# Patient Record
Sex: Female | Born: 1960 | Race: White | Hispanic: Yes | Marital: Married | Smoking: Never smoker
Health system: Southern US, Community
[De-identification: ages and names within clinical notes are randomized; demographics above are authoritative.]

---

## 2005-05-10 ENCOUNTER — Emergency Department (HOSPITAL_COMMUNITY): Admission: EM | Admit: 2005-05-10 | Discharge: 2005-05-10 | Payer: Self-pay | Admitting: Emergency Medicine

## 2008-05-15 ENCOUNTER — Emergency Department (HOSPITAL_COMMUNITY): Admission: EM | Admit: 2008-05-15 | Discharge: 2008-05-15 | Payer: Self-pay | Admitting: Emergency Medicine

## 2013-04-11 ENCOUNTER — Encounter (HOSPITAL_COMMUNITY): Payer: Self-pay | Admitting: Emergency Medicine

## 2013-04-11 ENCOUNTER — Emergency Department (HOSPITAL_COMMUNITY): Payer: Self-pay

## 2013-04-11 ENCOUNTER — Emergency Department (HOSPITAL_COMMUNITY)
Admission: EM | Admit: 2013-04-11 | Discharge: 2013-04-11 | Disposition: A | Discharge status: Home / Self Care | Payer: Self-pay | Attending: Emergency Medicine | Admitting: Emergency Medicine

## 2013-04-11 DIAGNOSIS — IMO0002 Reserved for concepts with insufficient information to code with codable children: Secondary | ICD-10-CM | POA: Insufficient documentation

## 2013-04-11 DIAGNOSIS — M1710 Unilateral primary osteoarthritis, unspecified knee: Secondary | ICD-10-CM | POA: Insufficient documentation

## 2013-04-11 DIAGNOSIS — M1712 Unilateral primary osteoarthritis, left knee: Secondary | ICD-10-CM

## 2013-04-11 MED ORDER — ONDANSETRON HCL 4 MG PO TABS
4.0000 mg | ORAL_TABLET | Freq: Once | ORAL | Status: AC
  Administered 2013-04-11: 4 mg via ORAL
  Filled 2013-04-11: qty 1

## 2013-04-11 MED ORDER — HYDROCODONE-ACETAMINOPHEN 5-325 MG PO TABS: 1.0000 | ORAL_TABLET | ORAL | Status: DC | PRN

## 2013-04-11 MED ORDER — HYDROCODONE-ACETAMINOPHEN 5-325 MG PO TABS
2.0000 | ORAL_TABLET | Freq: Once | ORAL | Status: AC
  Administered 2013-04-11: 2 via ORAL
  Filled 2013-04-11: qty 2

## 2013-04-11 MED ORDER — IBUPROFEN 800 MG PO TABS: 800.0000 mg | ORAL_TABLET | Freq: Three times a day (TID) | ORAL | Status: DC

## 2013-04-11 MED ORDER — IBUPROFEN 800 MG PO TABS
800.0000 mg | ORAL_TABLET | Freq: Once | ORAL | Status: AC
  Administered 2013-04-11: 800 mg via ORAL
  Filled 2013-04-11: qty 1

## 2013-04-11 NOTE — ED Notes (Signed)
Patient c/o left knee pain and swelling x 2 weeks.  States hurts worse to walk or bend.

## 2013-04-11 NOTE — ED Provider Notes (Signed)
CSN: [id number]     Arrival date & time 04/11/13  1751 History   First MD Initiated Contact with Patient 04/11/13 2030     Chief Complaint  Patient presents with  . Knee Pain   (Consider location/radiation/quality/duration/timing/severity/associated sxs/prior Treatment) Patient is a 52 y.o. female presenting with knee pain. The history is provided by the patient. The history is limited by a language barrier. A language interpreter was used.  Knee Pain Location:  Knee Time since incident:  2 weeks Injury: no   Knee location:  L knee Pain details:    Quality:  Aching and throbbing   Radiates to:  Does not radiate   Severity:  Moderate   Onset quality:  Gradual   Duration:  2 weeks   Timing:  Intermittent   Progression:  Worsening Chronicity:  Recurrent Dislocation: no   Foreign body present:  No foreign bodies Prior injury to area:  No Relieved by:  Nothing Exacerbated by: standing or bending. Associated symptoms: decreased ROM and stiffness   Associated symptoms: no back pain, no fever, no neck pain and no numbness     History reviewed. No pertinent past medical history. Past Surgical History  Procedure Laterality Date  . Cesarean section     No family history on file. History  Substance Use Topics  . Smoking status: Never Smoker   . Smokeless tobacco: Not on file  . Alcohol Use: No   OB History   Grav Para Term Preterm Abortions TAB SAB Ect Mult Living                 Review of Systems  Constitutional: Negative for fever and activity change.       All ROS Neg except as noted in HPI  HENT: Negative for nosebleeds and neck pain.   Eyes: Negative for photophobia and discharge.  Respiratory: Negative for cough, shortness of breath and wheezing.   Cardiovascular: Negative for chest pain and palpitations.  Gastrointestinal: Negative for abdominal pain and blood in stool.  Genitourinary: Negative for dysuria, frequency and hematuria.  Musculoskeletal: Positive for  arthralgias and stiffness. Negative for back pain.  Skin: Negative.   Neurological: Negative for dizziness, seizures and speech difficulty.  Psychiatric/Behavioral: Negative for hallucinations and confusion.    Allergies  Review of patient's allergies indicates no known allergies.  Home Medications  No current outpatient prescriptions on file. BP 140/87  Pulse 80  Temp(Src) 98 F (36.7 C) (Oral)  Resp 20  Ht 5\' 5"  (1.651 m)  Wt 187 lb (84.823 kg)  BMI 31.12 kg/m2  SpO2 100% Physical Exam  Nursing note and vitals reviewed. Constitutional: She is oriented to person, place, and time. She appears well-developed and well-nourished.  Non-toxic appearance.  HENT:  Head: Normocephalic.  Right Ear: Tympanic membrane and external ear normal.  Left Ear: Tympanic membrane and external ear normal.  Eyes: EOM and lids are normal. Pupils are equal, round, and reactive to light.  Neck: Normal range of motion. Neck supple. Carotid bruit is not present.  Cardiovascular: Normal rate, regular rhythm, normal heart sounds, intact distal pulses and normal pulses.   Pulmonary/Chest: Breath sounds normal. No respiratory distress.  Abdominal: Soft. Bowel sounds are normal. There is no tenderness. There is no guarding.  Musculoskeletal: Normal range of motion.  FROM of the left knee and ankle. Pain with flex/ext of the left knee. Crepitus present. No mass. No effusion. Left knee warmer than right.  Lymphadenopathy:       Head (  right side): No submandibular adenopathy present.       Head (left side): No submandibular adenopathy present.    She has no cervical adenopathy.  Neurological: She is alert and oriented to person, place, and time. She has normal strength. No cranial nerve deficit or sensory deficit.  Skin: Skin is warm and dry.  Psychiatric: She has a normal mood and affect. Her speech is normal.    ED Course  Procedures (including critical care time) Labs Review Labs Reviewed - No data to  display Imaging Review Dg Knee Complete 4 Views Left  04/11/2013   *RADIOLOGY REPORT*  Clinical Data: Left knee pain and swelling without injury  LEFT KNEE - COMPLETE 4+ VIEW  Comparison: None.  Findings: No acute fracture or dislocation is noted.  No gross soft tissue abnormality is seen.  No joint effusion is noted.  IMPRESSION: No acute abnormality noted.   Original Report Authenticated By: Alcide Clever, M.D.    MDM  No diagnosis found. **I have reviewed nursing notes, vital signs, and all appropriate lab and imaging results for this patient.*  Pt states through interpreter that she does a lot of standing and bending. She has had problem with her knees in the past, but they are worse today. Xray of the left knee is negative for fx or dislocation. Pt has crepitus with movement. Some stiffness noted. Suspect DJD to be the problem.  Kathie Dike, PA-C 04/12/13 0102

## 2013-04-15 NOTE — ED Provider Notes (Signed)
Medical screening examination/treatment/procedure(s) were performed by non-physician practitioner and as supervising physician I was immediately available for consultation/collaboration.  Tylin Stradley, MD 04/15/13 1538 

## 2013-04-22 ENCOUNTER — Telehealth: Payer: Self-pay | Admitting: Orthopedic Surgery

## 2013-04-22 NOTE — Telephone Encounter (Signed)
Kelly Hayden, daughter of Kelly Hayden called today to schedule an appointment  For Kelly Hayden to be seen today as a followup from [facility] 04/11/13 for knee pain /sweling. Told her we had nothing available today and it will most likely be next week.  She said she will call Kelly Hayden, and try to get something for today.  Will call us back if  she needs to.  Her # [phone]

## 2013-04-26 ENCOUNTER — Other Ambulatory Visit (HOSPITAL_COMMUNITY): Payer: Self-pay | Admitting: Orthopaedic Surgery

## 2013-04-26 DIAGNOSIS — M25562 Pain in left knee: Secondary | ICD-10-CM

## 2013-05-01 ENCOUNTER — Ambulatory Visit (HOSPITAL_COMMUNITY): Payer: Self-pay

## 2013-05-08 ENCOUNTER — Encounter: Payer: Self-pay | Admitting: Orthopedic Surgery

## 2013-05-08 ENCOUNTER — Ambulatory Visit: Payer: Self-pay | Admitting: Orthopedic Surgery

## 2013-11-08 ENCOUNTER — Encounter (HOSPITAL_COMMUNITY): Payer: Self-pay | Admitting: Emergency Medicine

## 2013-11-08 ENCOUNTER — Emergency Department (HOSPITAL_COMMUNITY)
Admission: EM | Admit: 2013-11-08 | Discharge: 2013-11-08 | Disposition: A | Discharge status: Home / Self Care | Payer: Self-pay | Attending: Emergency Medicine | Admitting: Emergency Medicine

## 2013-11-08 DIAGNOSIS — Z9889 Other specified postprocedural states: Secondary | ICD-10-CM | POA: Insufficient documentation

## 2013-11-08 DIAGNOSIS — N390 Urinary tract infection, site not specified: Secondary | ICD-10-CM | POA: Insufficient documentation

## 2013-11-08 LAB — URINALYSIS, ROUTINE W REFLEX MICROSCOPIC
Bilirubin Urine: NEGATIVE
GLUCOSE, UA: NEGATIVE mg/dL
Ketones, ur: NEGATIVE mg/dL
Nitrite: NEGATIVE
PH: 6.5 (ref 5.0–8.0)
Specific Gravity, Urine: 1.01 (ref 1.005–1.030)
Urobilinogen, UA: 0.2 mg/dL (ref 0.0–1.0)

## 2013-11-08 LAB — URINE MICROSCOPIC-ADD ON

## 2013-11-08 MED ORDER — CEPHALEXIN 500 MG PO CAPS
500.0000 mg | ORAL_CAPSULE | Freq: Once | ORAL | Status: AC
  Administered 2013-11-08: 500 mg via ORAL
  Filled 2013-11-08: qty 1

## 2013-11-08 MED ORDER — CEPHALEXIN 500 MG PO CAPS: 500.0000 mg | ORAL_CAPSULE | Freq: Three times a day (TID) | ORAL | Status: DC

## 2013-11-08 NOTE — ED Provider Notes (Signed)
CSN: [id number]     Arrival date & time 11/08/13  0617 History  This chart was scribed for Benny Lennert, MD by Leone Payor, ED Scribe. This patient was seen in room [facility] and the patient's care was started 7:16 AM.    Chief Complaint  Patient presents with  . Dysuria      Patient is a 53 y.o. female presenting with dysuria. The history is provided by the patient. No language interpreter was used.  Dysuria Pain severity:  Moderate Onset quality:  Gradual Duration:  2 days Progression:  Worsening Chronicity:  New Relieved by:  Nothing Worsened by:  Nothing tried Urinary symptoms: frequent urination   Associated symptoms: no abdominal pain, no nausea and no vomiting     HPI Comments: Mineola Duan is a 53 y.o. female who presents to the Emergency Department complaining of gradual onset, gradually worsening dysuria with associated urinary frequency that began 2 days ago. She states the pain is not as severe when laying in bed. She denies vomiting.    History reviewed. No pertinent past medical history. Past Surgical History  Procedure Laterality Date  . Cesarean section     No family history on file. History  Substance Use Topics  . Smoking status: Never Smoker   . Smokeless tobacco: Not on file  . Alcohol Use: No   OB History   Grav Para Term Preterm Abortions TAB SAB Ect Mult Living                 Review of Systems  Constitutional: Negative for appetite change and fatigue.  HENT: Negative for congestion, ear discharge and sinus pressure.   Eyes: Negative for discharge.  Respiratory: Negative for cough.   Cardiovascular: Negative for chest pain.  Gastrointestinal: Negative for nausea, vomiting, abdominal pain and diarrhea.  Genitourinary: Positive for dysuria and frequency. Negative for hematuria.  Musculoskeletal: Negative for back pain.  Skin: Negative for rash.  Neurological: Negative for seizures and headaches.  Psychiatric/Behavioral: Negative  for hallucinations.      Allergies  Review of patient's allergies indicates no known allergies.  Home Medications   Current Outpatient Rx  Name  Route  Sig  Dispense  Refill  . HYDROcodone-acetaminophen (NORCO/VICODIN) 5-325 MG per tablet   Oral   Take 1 tablet by mouth every 4 (four) hours as needed for pain.   20 tablet   0   . ibuprofen (ADVIL,MOTRIN) 800 MG tablet   Oral   Take 1 tablet (800 mg total) by mouth 3 (three) times daily.   21 tablet   0    BP 131/75  Pulse 76  Temp(Src) 97.8 F (36.6 C) (Oral)  Resp 18  Ht 5\' 5"  (1.651 m)  Wt 185 lb (83.915 kg)  BMI 30.79 kg/m2  SpO2 96% Physical Exam  Nursing note and vitals reviewed. Constitutional: She is oriented to person, place, and time. She appears well-developed.  HENT:  Head: Normocephalic.  Eyes: Conjunctivae and EOM are normal. No scleral icterus.  Neck: Neck supple. No thyromegaly present.  Cardiovascular: Normal rate, regular rhythm and normal heart sounds.  Exam reveals no gallop and no friction rub.   No murmur heard. Pulmonary/Chest: Effort normal and breath sounds normal. No stridor. No respiratory distress. She has no wheezes. She has no rales. She exhibits no tenderness.  Abdominal: Soft. She exhibits no distension. There is no tenderness. There is no rebound.  Musculoskeletal: Normal range of motion. She exhibits no edema.  Lymphadenopathy:  She has no cervical adenopathy.  Neurological: She is oriented to person, place, and time. She exhibits normal muscle tone. Coordination normal.  Skin: No rash noted. No erythema.  Psychiatric: She has a normal mood and affect. Her behavior is normal.    ED Course  Procedures (including critical care time)  DIAGNOSTIC STUDIES: Oxygen Saturation is 96% on RA, adequate by my interpretation.    COORDINATION OF CARE: 7:17 AM Discussed treatment plan with pt at bedside and pt agreed to plan.   Labs Review Labs Reviewed  URINALYSIS, ROUTINE W REFLEX  MICROSCOPIC - Abnormal; Notable for the following:    Hgb urine dipstick LARGE (*)    Protein, ur TRACE (*)    Leukocytes, UA MODERATE (*)    All other components within normal limits  URINE MICROSCOPIC-ADD ON - Abnormal; Notable for the following:    Bacteria, UA FEW (*)    All other components within normal limits  URINE CULTURE   Imaging Review No results found.   EKG Interpretation None      MDM   Final diagnoses:  None    The chart was scribed for me under my direct supervision.  I personally performed the history, physical, and medical decision making and all procedures in the evaluation of this patient.Benny Lennert.   Dezmen Alcock L Aerial Dilley, MD 11/08/13 [phone]

## 2013-11-08 NOTE — ED Notes (Signed)
Urinary frequency/burning for 2 days  Noted small blood clot in urine this morning

## 2013-11-08 NOTE — Discharge Instructions (Signed)
Follow up when your finish your medicine

## 2013-11-10 LAB — URINE CULTURE

## 2018-05-08 ENCOUNTER — Emergency Department (HOSPITAL_COMMUNITY)
Admission: EM | Admit: 2018-05-08 | Discharge: 2018-05-08 | Disposition: A | Discharge status: Home / Self Care | Payer: Self-pay | Attending: Emergency Medicine | Admitting: Emergency Medicine

## 2018-05-08 ENCOUNTER — Other Ambulatory Visit: Payer: Self-pay

## 2018-05-08 ENCOUNTER — Emergency Department (HOSPITAL_COMMUNITY): Payer: Self-pay

## 2018-05-08 ENCOUNTER — Encounter (HOSPITAL_COMMUNITY): Payer: Self-pay | Admitting: Emergency Medicine

## 2018-05-08 DIAGNOSIS — N3091 Cystitis, unspecified with hematuria: Secondary | ICD-10-CM | POA: Insufficient documentation

## 2018-05-08 LAB — COMPREHENSIVE METABOLIC PANEL
ALBUMIN: 4.9 g/dL (ref 3.5–5.0)
ALT: 35 U/L (ref 0–44)
AST: 28 U/L (ref 15–41)
Alkaline Phosphatase: 79 U/L (ref 38–126)
Anion gap: 12 (ref 5–15)
BILIRUBIN TOTAL: 0.8 mg/dL (ref 0.3–1.2)
BUN: 11 mg/dL (ref 6–20)
CO2: 24 mmol/L (ref 22–32)
Calcium: 9.7 mg/dL (ref 8.9–10.3)
Chloride: 104 mmol/L (ref 98–111)
Creatinine, Ser: 0.62 mg/dL (ref 0.44–1.00)
GFR calc Af Amer: >60 mL/min (ref >60)
GFR calc non Af Amer: >60 mL/min (ref >60)
GLUCOSE: 110 mg/dL — AB (ref 70–99)
POTASSIUM: 3.5 mmol/L (ref 3.5–5.1)
Sodium: 140 mmol/L (ref 135–145)
TOTAL PROTEIN: 8.8 g/dL — AB (ref 6.5–8.1)

## 2018-05-08 LAB — URINALYSIS, ROUTINE W REFLEX MICROSCOPIC
Bilirubin Urine: NEGATIVE
Glucose, UA: NEGATIVE mg/dL
Nitrite: NEGATIVE

## 2018-05-08 LAB — LIPASE, BLOOD: LIPASE: 29 U/L (ref 11–51)

## 2018-05-08 LAB — URINALYSIS, MICROSCOPIC (REFLEX)
Bacteria, UA: NONE SEEN
Squamous Epithelial / LPF: NONE SEEN (ref 0–5)
WBC, UA: NONE SEEN WBC/hpf (ref 0–5)

## 2018-05-08 LAB — CBC
HCT: 45.3 % (ref 36.0–46.0)
Hemoglobin: 15.6 g/dL — ABNORMAL HIGH (ref 12.0–15.0)
MCH: 29.9 pg (ref 26.0–34.0)
MCHC: 34.4 g/dL (ref 30.0–36.0)
MCV: 86.9 fL (ref 80.0–100.0)
PLATELETS: 284 10*3/uL (ref 150–400)
RBC: 5.21 MIL/uL — AB (ref 3.87–5.11)
RDW: 12.4 % (ref 11.5–15.5)
WBC: 17 10*3/uL — ABNORMAL HIGH (ref 4.0–10.5)
nRBC: 0 % (ref 0.0–0.2)

## 2018-05-08 LAB — SAMPLE TO BLOOD BANK

## 2018-05-08 MED ORDER — SODIUM CHLORIDE 0.9 % IV BOLUS
1000.0000 mL | Freq: Once | INTRAVENOUS | Status: AC
  Administered 2018-05-08: 1000 mL via INTRAVENOUS

## 2018-05-08 MED ORDER — CEPHALEXIN 500 MG PO CAPS: 500.0000 mg | ORAL_CAPSULE | Freq: Four times a day (QID) | ORAL | 0 refills | Status: DC

## 2018-05-08 MED ORDER — SODIUM CHLORIDE 0.9 % IV SOLN
1.0000 g | Freq: Once | INTRAVENOUS | Status: AC
  Administered 2018-05-08: 1 g via INTRAVENOUS
  Filled 2018-05-08: qty 10

## 2018-05-08 NOTE — ED Provider Notes (Addendum)
[facility] EMERGENCY DEPARTMENT Provider Note   CSN: [id number] Arrival date & time: 05/08/18  4098     History   Chief Complaint Chief Complaint  Patient presents with  . Hematuria  . Abdominal Pain    HPI Kelly Hayden is a 57 y.o. female.  Pt presents to the ED today with hematuria and lower abdominal pain.  Sx started last night and worsened today.  The pt said there were chunks of blood in her urine.  She said it hurts and burns when she urinates.  She is sure it is not from her vagina.  She denies any f/c.  No n/v.  No back pain.    She speaks Spanish, and wants her son to interpret.     History reviewed. No pertinent past medical history.  There are no active problems to display for this patient.   Past Surgical History:  Procedure Laterality Date  . CESAREAN SECTION       OB History   None      Home Medications    Prior to Admission medications   Medication Sig Start Date End Date Taking? Authorizing Provider  amoxicillin (AMOXIL) 500 MG capsule Take 500 mg by mouth 2 (two) times daily. 04/29/18 05/09/18 Yes [provider]  cephALEXin (KEFLEX) 500 MG capsule Take 1 capsule (500 mg total) by mouth 4 (four) times daily. 05/08/18   Jacalyn Lefevre, MD    Family History No family history on file.  Social History Social History   Tobacco Use  . Smoking status: Never Smoker  . Smokeless tobacco: Never Used  Substance Use Topics  . Alcohol use: No  . Drug use: No     Allergies   Patient has no known allergies.   Review of Systems Review of Systems  Genitourinary: Positive for hematuria.  All other systems reviewed and are negative.    Physical Exam Updated Vital Signs BP (!) 152/93 (BP Location: Right Arm)   Pulse 97   Temp 98.6 F (37 C) (Oral)   Resp 20   Ht 5\' 5"  (1.651 m)   Wt 79.4 kg   SpO2 100%   BMI 29.12 kg/m   Physical Exam  Constitutional: She is oriented to person, place, and time. She appears  well-developed and well-nourished.  HENT:  Head: Normocephalic and atraumatic.  Mouth/Throat: Oropharynx is clear and moist.  Eyes: Pupils are equal, round, and reactive to light. EOM are normal.  Cardiovascular: Normal rate, regular rhythm, normal heart sounds and intact distal pulses.  Pulmonary/Chest: Effort normal and breath sounds normal.  Abdominal: Soft. Normal appearance and bowel sounds are normal. There is tenderness in the suprapubic area.  Neurological: She is alert and oriented to person, place, and time.  Skin: Skin is warm and dry. Capillary refill takes less than 2 seconds.  Psychiatric: She has a normal mood and affect. Her behavior is normal.  Nursing note and vitals reviewed.    ED Treatments / Results  Labs (all labs ordered are listed, but only abnormal results are displayed) Labs Reviewed  COMPREHENSIVE METABOLIC PANEL - Abnormal; Notable for the following components:      Result Value   Glucose, Bld 110 (*)    Total Protein 8.8 (*)    All other components within normal limits  CBC - Abnormal; Notable for the following components:   WBC 17.0 (*)    RBC 5.21 (*)    Hemoglobin 15.6 (*)    All other components within normal limits  URINALYSIS, ROUTINE W REFLEX MICROSCOPIC - Abnormal; Notable for the following components:   Color, Urine RED (*)    APPearance TURBID (*)    Hgb urine dipstick   (*)    Value: TEST NOT REPORTED DUE TO COLOR INTERFERENCE OF URINE PIGMENT   Ketones, ur   (*)    Value: TEST NOT REPORTED DUE TO COLOR INTERFERENCE OF URINE PIGMENT   Protein, ur   (*)    Value: TEST NOT REPORTED DUE TO COLOR INTERFERENCE OF URINE PIGMENT   Leukocytes, UA   (*)    Value: TEST NOT REPORTED DUE TO COLOR INTERFERENCE OF URINE PIGMENT   All other components within normal limits  URINE CULTURE  LIPASE, BLOOD  URINALYSIS, MICROSCOPIC (REFLEX)    EKG None  Radiology Ct Renal Stone Study  Result Date: 05/08/2018 CLINICAL DATA:  57 year old female  with gradual onset of worsening dysuria. Urinary frequency started 2 days ago. Hematuria. Initial encounter. EXAM: CT ABDOMEN AND PELVIS WITHOUT CONTRAST TECHNIQUE: Multidetector CT imaging of the abdomen and pelvis was performed following the standard protocol without IV contrast. COMPARISON:  None. FINDINGS: Exam is slightly motion degraded. Lower chest: No worrisome lung base abnormality. Heart size top-normal. Hepatobiliary: Posterior inferior right lobe of liver with either 3 cysts side-by-side or a septated single cystic structure spanning over 4.7 cm. Prominent size gallbladder without calcified gallstones. No obvious inflammation although evaluation limited by motion degradation. Pancreas: Taking into account limitation by non contrast imaging, no worrisome pancreatic mass. Spleen: Taking into account limitation by non contrast imaging, no worrisome mass or enlargement. Adrenals/Urinary Tract: No obstructing stone or hydronephrosis. Left upper pole 2 cm cyst. Taking into account limitation by non contrast imaging, no worrisome renal or adrenal lesion. Abnormal appearance of the posterior aspect of the urinary bladder with irregular dense material which may represent debris, blood or bladder tumor. Stomach/Bowel: No extraluminal bowel inflammatory process. Portions of stomach small bowel and colon under distended and evaluation slightly limited. Vascular/Lymphatic: No abdominal aortic aneurysm. Scattered normal size lymph nodes. Reproductive: No worrisome abnormality. Other: No free intraperitoneal air or bowel containing hernia. Slight diastases rectus muscle. Bowel extends immediately below this region. Musculoskeletal: No osseous destructive lesion. IMPRESSION: 1. Exam is slightly motion degraded. 2. Abnormal appearance of the posterior aspect of the urinary bladder with irregular dense material which may represent debris, blood or bladder tumor. 3. Posterior right lobe of liver with either 3 cysts  side-by-side or a septated single cystic structure spanning over 4.7 cm. 4. Prominent size gallbladder without calcified gallstones. No obvious inflammation although evaluation limited by motion degradation. 5. Left upper pole 2 cm cyst. Electronically Signed   By: Lacy Duverney M.D.   On: 05/08/2018 13:49    Procedures Procedures (including critical care time)  Medications Ordered in ED Medications  sodium chloride 0.9 % bolus 1,000 mL (has no administration in time range)  cefTRIAXone (ROCEPHIN) 1 g in sodium chloride 0.9 % 100 mL IVPB (has no administration in time range)     Initial Impression / Assessment and Plan / ED Course  I have reviewed the triage vital signs and the nursing notes.  Pertinent labs & imaging results that were available during my care of the patient were reviewed by me and considered in my medical decision making (see chart for details).  Information conveyed through her son.  All questions answered.  Pt's hematuria interferes with UA, so urine will be sent for Cx.  She clinically has an uti.  She will be given rocephin 1g here, then d/c home with keflex.  She is instructed to f/u with urology if bleeding does not resolve.  She knows to return here if sx worsen.  Final Clinical Impressions(s) / ED Diagnoses   Final diagnoses:  Hemorrhagic cystitis    ED Discharge Orders         Ordered    cephALEXin (KEFLEX) 500 MG capsule  4 times daily     05/08/18 1409           Jacalyn Lefevre, MD 05/08/18 1411    Jacalyn Lefevre, MD 05/08/18 [phone]

## 2018-05-08 NOTE — ED Triage Notes (Signed)
Pt states having hematuria with lower abdominal pain since last night.  Pt denies vaginal bleeding.

## 2018-05-10 LAB — URINE CULTURE: Culture: <10000 — AB

## 2018-08-13 ENCOUNTER — Emergency Department (HOSPITAL_COMMUNITY)
Admission: EM | Admit: 2018-08-13 | Discharge: 2018-08-13 | Disposition: A | Discharge status: Home / Self Care | Payer: Self-pay | Attending: Emergency Medicine | Admitting: Emergency Medicine

## 2018-08-13 ENCOUNTER — Other Ambulatory Visit: Payer: Self-pay

## 2018-08-13 ENCOUNTER — Encounter (HOSPITAL_COMMUNITY): Payer: Self-pay | Admitting: Emergency Medicine

## 2018-08-13 DIAGNOSIS — N3091 Cystitis, unspecified with hematuria: Secondary | ICD-10-CM | POA: Insufficient documentation

## 2018-08-13 LAB — URINALYSIS, ROUTINE W REFLEX MICROSCOPIC
BACTERIA UA: NONE SEEN
BILIRUBIN URINE: NEGATIVE
GLUCOSE, UA: NEGATIVE mg/dL
Ketones, ur: NEGATIVE mg/dL
NITRITE: NEGATIVE
PROTEIN: NEGATIVE mg/dL
RBC / HPF: >50 RBC/hpf — ABNORMAL HIGH (ref 0–5)
SPECIFIC GRAVITY, URINE: 1.008 (ref 1.005–1.030)
pH: 7 (ref 5.0–8.0)

## 2018-08-13 MED ORDER — PHENAZOPYRIDINE HCL 100 MG PO TABS
200.0000 mg | ORAL_TABLET | Freq: Once | ORAL | Status: AC
  Administered 2018-08-13: 200 mg via ORAL
  Filled 2018-08-13: qty 2

## 2018-08-13 MED ORDER — PHENAZOPYRIDINE HCL 200 MG PO TABS: 200.0000 mg | ORAL_TABLET | Freq: Three times a day (TID) | ORAL | 0 refills | Status: AC

## 2018-08-13 MED ORDER — CEPHALEXIN 500 MG PO CAPS
500.0000 mg | ORAL_CAPSULE | Freq: Once | ORAL | Status: AC
  Administered 2018-08-13: 500 mg via ORAL
  Filled 2018-08-13: qty 1

## 2018-08-13 MED ORDER — CEPHALEXIN 500 MG PO CAPS: 500.0000 mg | ORAL_CAPSULE | Freq: Four times a day (QID) | ORAL | 0 refills | Status: AC

## 2018-08-13 NOTE — ED Triage Notes (Signed)
Pt C/O hematuria that started around 2 hours ago. Pt C/O chills and suprapubic pain.

## 2018-08-13 NOTE — Discharge Instructions (Addendum)
As discussed, it appears you have a recurrence of bladder infection but you really need to followup with a urologist for further testing to determine the source of these persistent symptoms (especially the chronic blood in your urine).  Take the entire course of the antibiotics prescribed. Call the number listed above to arrange this appointment.  In the interim, return here for any new or worsened symptoms.  Increase your fluid intake.

## 2018-08-14 NOTE — ED Provider Notes (Signed)
[facility] EMERGENCY DEPARTMENT Provider Note   CSN: [id number] Arrival date & time: 08/13/18  1954     History   Chief Complaint Chief Complaint  Patient presents with  . Hematuria    HPI Kelly Hayden is a 58 y.o. female.  The history is provided by the patient and a relative (adult daugher offering assistance). The history is limited by a language barrier. A language interpreter was used.  Hematuria  This is a recurrent problem. The current episode started 1 to 2 hours ago. Episode frequency: has had 2 episodes since sx began. The problem has not changed since onset.Associated symptoms include abdominal pain. Pertinent negatives include no chest pain and no shortness of breath. Associated symptoms comments: Reports chills without fever and describes suprapubic pressure sensation, even after urination along with painful urination.  Denies back or flank pain.. Nothing relieves the symptoms. She has tried nothing for the symptoms.   Pt was seen for similar sx here in October 2019, treated for hemorrhagic cystitis with abx.  Pt endorses her sx resolved until today, although states she had f/u with pcp (clinic at [facility]) and still had microscopic blood in her urine. No referrals given at that time, was told probably just residual bleeding from the infection or possibly passing a kidney stone (no hx of this).   History reviewed. No pertinent past medical history.  There are no active problems to display for this patient.   Past Surgical History:  Procedure Laterality Date  . CESAREAN SECTION       OB History   No obstetric history on file.      Home Medications    Prior to Admission medications   Medication Sig Start Date End Date Taking? Authorizing Provider  cephALEXin (KEFLEX) 500 MG capsule Take 1 capsule (500 mg total) by mouth 4 (four) times daily. 08/13/18   Burgess Amor, PA-C  phenazopyridine (PYRIDIUM) 200 MG tablet Take 1 tablet (200 mg total) by  mouth 3 (three) times daily. 08/13/18   Burgess Amor, PA-C    Family History No family history on file.  Social History Social History   Tobacco Use  . Smoking status: Never Smoker  . Smokeless tobacco: Never Used  Substance Use Topics  . Alcohol use: No  . Drug use: No     Allergies   Patient has no known allergies.   Review of Systems Review of Systems  Constitutional: Positive for chills. Negative for fever.  HENT: Negative.   Respiratory: Negative for shortness of breath.   Cardiovascular: Negative for chest pain.  Gastrointestinal: Positive for abdominal pain. Negative for diarrhea, nausea and vomiting.  Genitourinary: Positive for dysuria, frequency and hematuria. Negative for flank pain, vaginal bleeding and vaginal discharge.  Musculoskeletal: Negative.   Skin: Negative.      Physical Exam Updated Vital Signs BP (!) 149/85 (BP Location: Right Arm)   Pulse 69   Temp 98.1 F (36.7 C) (Temporal)   Resp 18   Ht 5\' 4"  (1.626 m)   Wt 78 kg   SpO2 99%   BMI 29.52 kg/m   Physical Exam Vitals signs and nursing note reviewed.  Constitutional:      Appearance: She is well-developed.  HENT:     Head: Normocephalic and atraumatic.  Eyes:     Conjunctiva/sclera: Conjunctivae normal.  Neck:     Musculoskeletal: Normal range of motion.  Cardiovascular:     Rate and Rhythm: Normal rate and regular rhythm.  Heart sounds: Normal heart sounds.  Pulmonary:     Effort: Pulmonary effort is normal.     Breath sounds: Normal breath sounds. No wheezing.  Abdominal:     General: Bowel sounds are normal.     Palpations: Abdomen is soft.     Tenderness: There is abdominal tenderness. There is no right CVA tenderness or left CVA tenderness.     Comments: Mild ttp suprapubic, no guarding.  Musculoskeletal: Normal range of motion.  Skin:    General: Skin is warm and dry.  Neurological:     Mental Status: She is alert.      ED Treatments / Results  Labs (all  labs ordered are listed, but only abnormal results are displayed) Labs Reviewed  URINALYSIS, ROUTINE W REFLEX MICROSCOPIC - Abnormal; Notable for the following components:      Result Value   Color, Urine STRAW (*)    Hgb urine dipstick MODERATE (*)    Leukocytes, UA MODERATE (*)    RBC / HPF >50 (*)    WBC, UA >50 (*)    All other components within normal limits    EKG None  Radiology No results found.  Procedures Procedures (including critical care time)  Medications Ordered in ED Medications  cephALEXin (KEFLEX) capsule 500 mg (500 mg Oral Given 08/13/18 2332)  phenazopyridine (PYRIDIUM) tablet 200 mg (200 mg Oral Given 08/13/18 2332)     Initial Impression / Assessment and Plan / ED Course  I have reviewed the triage vital signs and the nursing notes.  Pertinent labs & imaging results that were available during my care of the patient were reviewed by me and considered in my medical decision making (see chart for details).     Pt with copious blood in urine, which is probably obscuring the dip results. Given dysuria sx will cover for uti, keflex started, also pyridium for sx relief.  Review of last visit revealing for CT imaging suggesting irregularity along posterior bladder wall, possibly debris vs blood vs tumor.  Discussed with pt and family and strongly recommended need for urology consult to further evaluate her sx and that CT finding.  No renal stones present during that study.  Referral given, pt and family understands to call for appt f/u. Return precautions discussed.  Final Clinical Impressions(s) / ED Diagnoses   Final diagnoses:  Hemorrhagic cystitis    ED Discharge Orders         Ordered    cephALEXin (KEFLEX) 500 MG capsule  4 times daily     08/13/18 2329    phenazopyridine (PYRIDIUM) 200 MG tablet  3 times daily     08/13/18 2329           Burgess Amordol, Obdulio Mash, PA-C 08/14/18 1348    Long, Arlyss RepressJoshua G, MD 08/14/18 1529

## 2018-08-17 ENCOUNTER — Other Ambulatory Visit (HOSPITAL_COMMUNITY): Payer: Self-pay | Admitting: Urology

## 2018-08-17 ENCOUNTER — Other Ambulatory Visit: Payer: Self-pay | Admitting: Urology

## 2018-08-17 DIAGNOSIS — R310 Gross hematuria: Secondary | ICD-10-CM

## 2018-08-27 ENCOUNTER — Encounter: Payer: Self-pay | Admitting: *Deleted

## 2018-08-27 ENCOUNTER — Ambulatory Visit: Payer: Self-pay | Admitting: *Deleted

## 2018-08-27 ENCOUNTER — Ambulatory Visit
Admission: RE | Admit: 2018-08-27 | Discharge: 2018-08-27 | Disposition: A | Discharge status: Home / Self Care | Payer: Self-pay | Source: Ambulatory Visit | Attending: Oncology | Admitting: Oncology

## 2018-08-27 VITALS — BP 163/89 | HR 70 | Temp 97.8°F | Ht 64.0 in | Wt 170.5 lb

## 2018-08-27 DIAGNOSIS — Z0000 Encounter for general adult medical examination without abnormal findings: Secondary | ICD-10-CM

## 2018-08-27 NOTE — Progress Notes (Signed)
  Subjective:     Patient ID: Kelly Hayden, female   DOB: 1961/01/23, 58 y.o.   MRN: [id number]  HPI   Review of Systems     Objective:   Physical Exam Chest:     Breasts:        Right: No swelling, bleeding, inverted nipple, mass, nipple discharge, skin change or tenderness.        Left: Inverted nipple present. No swelling, bleeding, mass, nipple discharge, skin change or tenderness.    Abdominal:     Palpations: There is no hepatomegaly or splenomegaly.  Genitourinary:    Exam position: Lithotomy position.     Labia:        Right: No rash, tenderness, lesion or injury.        Left: No rash, tenderness, lesion or injury.      Cervix: No cervical motion tenderness, discharge, friability, lesion, erythema or cervical bleeding.     Uterus: Not deviated, not enlarged, not fixed, not tender and no uterine prolapse.      Adnexa:        Right: No mass, tenderness or fullness.         Left: No mass, tenderness or fullness.       Rectum: No mass.     Lymphadenopathy:     Upper Body:     Right upper body: No supraclavicular or axillary adenopathy.     Left upper body: No supraclavicular or axillary adenopathy.        Assessment:     58 year old Hispanic female referred to [facility] by [facility] for clinical breast exam and mammogram.  Kelly Hayden, the interpreter present during the interview and exam.  Clinical breast exam reveals the left nipple to be inverted.  Patient states this is normal for her.  Taught self breast awareness.  Last pap on 09/07/15 was negative without HPV co-testing.  She is due for her next pap.  States she would like to it completed today since we can do it in our clinic while she is here.  Specimen collected for pap smear.  Blood pressure elevated at 163/95 .  She is to recheck her blood pressure at [facility] or [facility], and if remains higher than 140/90 she is to follow-up with her primary care provider.  She is agreeable.  Patient has been screened  for eligibility.  She does not have any insurance, Medicare or Medicaid.  She also meets financial eligibility.  Hand-out given on the Affordable Care Act.  Risk Assessment    Risk Scores      08/27/2018   Last edited by: Alta Corning, CMA   5-year risk: 0.8 %   Lifetime risk: 4.8 %            Plan:     Screening mammogram ordered.  Specimen for pap sent to the lab.  Will follow-up per [facility] protocol.

## 2018-08-27 NOTE — Patient Instructions (Signed)
Hipertensin Hypertension Introduccin La hipertensin, conocida comnmente como presin arterial alta, se produce cuando la sangre bombea en las arterias con mucha fuerza. Las arterias son los vasos sanguneos que transportan la sangre desde el corazn al resto del cuerpo. La hipertensin hace que el corazn haga ms esfuerzo para Insurance account manager y Sears Holdings Corporation que las arterias se Armed forces training and education officer o Multimedia programmer. La hipertensin no tratada o no controlada puede causar infarto de miocardio, accidentes cerebrovasculares, enfermedad renal y otros problemas. Una lectura de la presin arterial consiste de un nmero ms alto sobre un nmero ms bajo. En condiciones ideales, la presin arterial debe estar por debajo de 120/80. El primer nmero ("superior") es la presin sistlica. Es la medida de la presin de las arterias cuando el corazn late. El segundo nmero ("inferior") es la presin diastlica. Es la medida de la presin en las arterias cuando el corazn se relaja. Cules son las causas? Se desconoce la causa de esta afeccin. Qu incrementa el riesgo? Algunos factores de riesgo de hipertensin estn bajo su control. Otros no. Factores que puede Omnicom.  Tener diabetes mellitus tipo 2, colesterol alto, o ambos.  No hacer la cantidad suficiente de actividad fsica o ejercicio.  Tener sobrepeso.  Consumir mucha grasa, azcar, caloras o sal (sodio) en su dieta.  Beber alcohol en exceso. Factores que son difciles o imposibles de modificar  Tener enfermedad renal crnica.  Tener antecedentes familiares de presin arterial alta.  La edad. Los riesgos aumentan con la edad.  La raza. El riesgo es mayor para las Statistician.  El sexo. Antes de los 45aos, los hombres corren ms [employer]. Despus de los 65aos, las mujeres corren ms [employer].  Tener apnea obstructiva del sueo.  El estrs. Cules son los signos o los sntomas? La presin  arterial extremadamente alta (crisis hipertensiva) puede provocar:  Dolor de [place].  Ansiedad.  Falta de aire.  Hemorragia nasal.  Nuseas y vmitos.  Dolor de pecho intenso.  Una crisis de movimientos que no puede controlar (convulsiones). Cmo se diagnostica? Esta afeccin se diagnostica midiendo su presin arterial mientras se encuentra sentado, con el brazo apoyado sobre una superficie. El brazalete del tensimetro debe colocarse directamente sobre la piel de la parte superior del brazo y al nivel de su corazn. Debe medirla al [facility] veces en el mismo brazo. Determinadas condiciones pueden causar una diferencia de presin arterial entre el brazo izquierdo y Aeronautical engineer. Ciertos factores pueden provocar que las lecturas de la presin arterial sean inferiores o superiores a lo normal (elevadas) por un perodo corto de tiempo:  Si su presin arterial es ms alta cuando se encuentra en el consultorio del mdico que cuando la mide en su hogar, se denomina "hipertensin de bata blanca". La Harley[employer]Davidson de las personas que tienen esta afeccin no deben ser Engelhard Corporation.  Si su presin arterial es ms alta en el hogar que cuando se encuentra en el consultorio del mdico, se denomina "hipertensin enmascarada". La Harley-Davidson de las personas que tienen esta afeccin deben ser medicadas para Chief Operating Officer la presin arterial. Si tiene una lecturas de presin arterial alta durante una visita o si tiene presin arterial normal con otros factores de riesgo:  Es posible que se le pida que regrese Banker para volver a Chief Operating Officer su presin arterial.  Se le puede pedir que se controle la presin arterial en su casa durante 1 semana o ms. Si se le diagnostica hipertensin, es posible que se le  realicen otros anlisis de sangre o estudios de diagnstico por imgenes para ayudar a su mdico a comprender su riesgo general de tener otras afecciones. Cmo se trata? Esta afeccin se trata haciendo cambios  saludables en el estilo de vida, tales como ingerir alimentos saludables, realizar ms ejercicio y reducir el consumo de alcohol. El mdico puede recetarle medicamentos si los cambios en el estilo de vida no son suficientes para Museum/gallery curator la presin arterial y si:  Su presin arterial sistlica est por encima de 130.  Su presin arterial diastlica est por encima de 80. La presin arterial deseada puede variar en funcin de las enfermedades, la edad y otros factores personales. Siga estas instrucciones en su casa: Comida y bebida   Siga una dieta con alto contenido de fibras y [place], y con bajo contenido de sodio, International aid/development worker agregada y Neurosurgeon. Un ejemplo de plan alimenticio es la dieta DASH (Dietary Approaches to Stop Hypertension, Mtodos alimenticios para detener la hipertensin). Para alimentarse de esta manera: ? Coma mucha fruta y verdura fresca. Trate de que la mitad del plato de cada comida sea de frutas y verduras. ? Coma cereales integrales, como pasta integral, arroz integral y pan integral. Llene aproximadamente un cuarto del plato con cereales integrales. ? Coma y beba productos lcteos con bajo contenido de grasa, como leche descremada o yogur bajo en grasas. ? Evite la ingesta de cortes de carne grasa, carne procesada o curada, y carne de ave con piel. Llene aproximadamente un cuarto del plato con protenas magras, como pescado, pollo sin piel, frijoles, huevos y tofu. ? Evite ingerir alimentos prehechos o procesados. En general, estos tienen mayor cantidad de sodio, azcar agregada y Steffanie [place].  Reduzca su ingesta diaria de sodio. La mayora de las personas que tienen hipertensin deben comer menos de 1500 mg de sodio por [employer].  Limite el consumo de alcohol a no ms de 1 medida por da si es mujer y no est Orthoptist y a 2 medidas por da si es hombre. Una medida equivale a 12onzas de cerveza, 5onzas de vino o 1onzas de bebidas alcohlicas de alta graduacin. Estilo de  vida  Trabaje con su mdico para mantener un peso saludable o Curator. Pregntele cual es su peso recomendado.  Realice al menos 30 minutos de ejercicio que haga que se acelere su corazn (ejercicio Magazine features editor) la DIRECTV de la [place]. Estas actividades pueden incluir caminar, nadar o andar en bicicleta.  Incluya ejercicios para fortalecer sus msculos (ejercicios de resistencia), como pilates o levantamiento de pesas, como parte de su rutina semanal de ejercicios. Intente realizar de este tipo de ejercicios al Kellogg a la [place].  No consuma ningn producto que contenga nicotina o tabaco, como cigarrillos y Administrator, Civil Service. Si necesita ayuda para dejar de fumar, consulte al mdico.  Contrlese la presin arterial en su casa segn las indicaciones del mdico.  Concurra a todas las visitas de control como se lo haya indicado el mdico. Esto es importante. Medicamentos  Baxter International de venta libre y los recetados solamente como se lo haya indicado el mdico. Siga cuidadosamente las indicaciones. Los medicamentos para la presin arterial deben tomarse segn las indicaciones.  No omita las dosis de medicamentos para la presin arterial. Si lo hace, estar en riesgo de tener problemas y puede hacer que los medicamentos sean menos eficaces.  Pregntele a su mdico a qu efectos secundarios o reacciones a los Museum/gallery curator. Comunquese con un mdico  si:  Piensa que tiene una reaccin a un medicamento que est tomando.  Tiene dolores de cabeza frecuentes (recurrentes).  Siente mareos.  Tiene hinchazn en los tobillos.  Tiene problemas de visin. Solicite ayuda de inmediato si:  Siente un dolor de cabeza intenso o confusin.  Siente debilidad inusual o adormecimiento.  Siente que va a desmayarse.  Siente un dolor intenso en el pecho o el abdomen.  Vomita repetidas veces.  Tiene dificultad para  respirar. Resumen  La hipertensin se produce cuando la sangre bombea en las arterias con mucha fuerza. Si esta afeccin no se controla, podra correr riesgo de tener complicaciones graves.  La presin arterial deseada puede variar en funcin de las enfermedades, la edad y otros factores personales. Para la [employer], una presin arterial normal es menor que 120/80.  La hipertensin se trata con cambios en el estilo de vida, medicamentos o una combinacin de [place]. Los Danaher Corporation estilo de vida incluyen prdida de peso, ingerir alimentos sanos, seguir una dieta baja en sodio, hacer ms ejercicio y Glass blower/designer consumo de alcohol. Esta informacin no tiene Theme park manager el consejo del mdico. Asegrese de hacerle al mdico cualquier pregunta que tenga. Document Released: 07/18/2005 Document Revised: 06/29/2016 Document Reviewed: 06/29/2016 Elsevier Interactive Patient Education  2019 [employer]. [place] del VPH HPV Test Por qu me debo realizar esta prueba? EL VPH (virus del papiloma humano) se refiere a un grupo de aproximadamente 100 virus. Muchos de estos virus causan tumores dentro de los genitales, sobre ellos o a su alrededor. La mayora de los VPH provocan infecciones que suelen desaparecer sin tratamiento.  La prueba del VPH se realiza para Engineer, manufacturing tipos de Conservator, museum/gallery (cepas) del VPH. Las cepas 16 y 18 se consideran las de mayor riesgo para Management consultant. Si tiene las cepas 16 o 18 del VPH y no se lo trata, Diplomatic Services operational officer de padecer cncer de cuello uterino, vagina, vulva o ano. El VPH afecta tanto a los hombres como a las mujeres. Sin embargo, la prueba del VPH se [place] para Engineer, manufacturing si existe un mayor riesgo de cncer en las mujeres que:  Tienen entre 30 y 70aos.  Tienen una prueba de Papanicolaou anormal.  Han recibido un tratamiento por una prueba de Papanicolaou anormal en el pasado.  Han recibido un tratamiento para una infeccin por el VPH de alto  riesgo en el pasado. Si usted es AmerisourceBergen Corporation mayor de 30 aos, pueden hacerle la prueba del VPH al mismo tiempo que un examen plvico y Neomia Dear prueba de Papanicolaou. Qu se analiza? Esta prueba identifica las cadenas de ADN (genticas) de la infeccin por VPH. Esta prueba tambin se denomina prueba de ADN del VPH. Qu tipo de [place] se toma?  Esta prueba requiere Lauris Poag de clulas del cuello uterino. Lo har utilizando un pequeo hisopo de algodn, una esptula de plstico o un cepillo. Esta muestra se recolecta durante un examen plvico, mientras usted est recostada boca arriba sobre la mesa de examen con los pies en los descansos para pies (estribos). Cmo debo prepararme para esta prueba?  A partir de 24 a 48 horas antes de su prueba, o segn se lo indique su mdico, no haga lo siguiente: ? Tomar baos. ? Tener [employer] [place] [employer]. ? Realizar duchas vaginales.  Programe la prueba durante un da en el que no est menstruando. Es posible que tenga que volver a Charity fundraiser la prueba si est Magazine features editor en que debe  realizrsela.  Se le pedir que orine justo antes de la prueba. Cmo se informan los resultados? Los resultados de la prueba se informarn como positivos o negativos para Copywriter, advertisingel VPH. Si el resultado de la prueba es positivo, este indicar la cepa del VPH que tiene. Qu significan los resultados? El resultado negativo de la prueba significa que no se detect VPH. Esto significa que es muy probable que no Chief Executive Officertenga el VPH. Un resultado positivo de la prueba del VPH significa que tiene el virus.  Si sus resultados revelan la presencia de cualquiera de las cepas de alto riesgo, usted puede tener un mayor riesgo de Geophysical data processordesarrollar cncer de ano o cuello uterino si la infeccin no se trata.  Si se encuentran cepas del VPH de bajo riesgo, es poco probable que tenga un alto riesgo de Geneticist, molecularpadecer cncer. Hable con su mdico sobre lo que significan sus [place]resultados. Preguntas para hacerle al  mdico Consulte a su mdico o pregunte en el departamento donde se realiza la prueba acerca de lo siguiente:  Cundo estarn disponibles mis resultados?  Cmo obtendr mis resultados?  Cules son mis opciones de tratamiento?  Qu otras pruebas necesito?  Cules son los prximos pasos que debo seguir? Resumen  La prueba del virus del Geneticist, molecularpapiloma humano (VPH) se [place] para Engineer, manufacturingdetectar los tipos de infeccin por el VPH de Conservator, museum/galleryalto riesgo. Esta prueba se realiza solo Allstateen las mujeres.  Los tipos 16 y 5318 del VPH se consideran de Conservator, museum/galleryalto riesgo. Si no se tratan, estos tipos de infecciones aumentan el riesgo de cncer de cuello uterino o de ano.  Un resultado negativo de la prueba del VPH significa que no se detect el VPH, y es muy probable que no tenga el virus.  Un resultado positivo de la prueba del VPH significa que tiene una infeccin por VPH. Esta informacin no tiene Theme park managercomo fin reemplazar el consejo del mdico. Asegrese de hacerle al mdico cualquier pregunta que tenga. Document Released: 11/03/2008 Document Revised: 08/11/2017 Document Reviewed: 08/11/2017 Elsevier Interactive Patient Education  2019 Arvin[employer]itor[employer]vier Inc.

## 2018-08-28 ENCOUNTER — Ambulatory Visit (HOSPITAL_COMMUNITY)
Admission: RE | Admit: 2018-08-28 | Discharge: 2018-08-28 | Disposition: A | Discharge status: Home / Self Care | Payer: Self-pay | Source: Ambulatory Visit | Attending: Urology | Admitting: Urology

## 2018-08-28 DIAGNOSIS — R310 Gross hematuria: Secondary | ICD-10-CM | POA: Insufficient documentation

## 2018-08-28 DIAGNOSIS — Z0000 Encounter for general adult medical examination without abnormal findings: Secondary | ICD-10-CM | POA: Insufficient documentation

## 2018-08-28 MED ORDER — IOPAMIDOL (ISOVUE-300) INJECTION 61%
150.0000 mL | Freq: Once | INTRAVENOUS | Status: AC | PRN
  Administered 2018-08-28: 125 mL via INTRAVENOUS

## 2018-08-30 LAB — PAP LB AND HPV HIGH-RISK: HPV, HIGH-RISK: NEGATIVE

## 2018-09-03 ENCOUNTER — Encounter: Payer: Self-pay | Admitting: *Deleted

## 2018-09-03 NOTE — Progress Notes (Signed)
Letter mailed to inform patient of her normal mammogram and pap smear.  Next mammogram due in 1 year and pap smear due in 5 years.  HSIS to [place].

## 2019-02-08 ENCOUNTER — Other Ambulatory Visit: Payer: Self-pay

## 2019-02-08 DIAGNOSIS — Z20822 Contact with and (suspected) exposure to covid-19: Secondary | ICD-10-CM

## 2019-02-15 LAB — NOVEL CORONAVIRUS, NAA: SARS-CoV-2, NAA: DETECTED — AB

## 2021-03-02 ENCOUNTER — Encounter: Payer: Self-pay | Admitting: *Deleted

## 2021-03-02 ENCOUNTER — Other Ambulatory Visit: Payer: Self-pay

## 2021-03-02 ENCOUNTER — Ambulatory Visit
Admission: RE | Admit: 2021-03-02 | Discharge: 2021-03-02 | Disposition: A | Discharge status: Home / Self Care | Payer: Self-pay | Source: Ambulatory Visit | Attending: Oncology | Admitting: Oncology

## 2021-03-02 ENCOUNTER — Ambulatory Visit: Payer: Self-pay | Attending: Oncology | Admitting: *Deleted

## 2021-03-02 VITALS — BP 132/79 | HR 71 | Temp 98.2°F | Resp 100 | Ht 64.0 in | Wt 184.1 lb

## 2021-03-02 DIAGNOSIS — Z0000 Encounter for general adult medical examination without abnormal findings: Secondary | ICD-10-CM

## 2021-03-02 NOTE — Progress Notes (Signed)
  Subjective:     Patient ID: Kelly Hayden, female   DOB: 1960-09-20, 60 y.o.   MRN: [id number]  HPI  [facility] Medical History Record - 03/02/21 1009       Breast History   Screening cycle Rescreen    CBE Date 08/27/18    Provider (CBE) [facility]    Initial Mammogram 03/02/21    Last Mammogram Annual    Last Mammogram Date 08/27/18    Provider (Mammogram)  [facility]    Recent Breast Symptoms None      Breast Cancer History   Breast Cancer History No personal or family history      Previous History of Breast Problems   Breast Surgery or Biopsy None    Breast Implants N/A    BSE Done Monthly      Gynecological/Obstetrical History   LMP --   7 years ago   Is there any chance that the client could be pregnant?  No    Age at menarche 29    Age at menopause 4    PAP smear history Annually    Date of last PAP  08/27/18    Provider (PAP) [facility]    Age at first live birth 32    Breast fed children Yes (type length in comments)   6 months   DES Exposure Unkown    Cervical, Uterine or Ovarian cancer No    Family history of Cervial, Uterine or Ovarian cancer Yes   mom had uterine cancer in her 50's   Hysterectomy No    Cervix removed No    Ovaries removed No    Laser/Cryosurgery No    Current method of birth control None    Current method of Estrogen/Hormone replacement Yes (specify in comments)   premarin vaginal cream   Smoking history None               Review of Systems     Objective:   Physical Exam Chest:  Breasts:    Right: No swelling, bleeding, inverted nipple, mass, nipple discharge, skin change, tenderness, axillary adenopathy or supraclavicular adenopathy.     Left: Inverted nipple present. No swelling, bleeding, mass, nipple discharge, skin change, tenderness, axillary adenopathy or supraclavicular adenopathy.       Comments: Patient very ticklish to palpation Lymphadenopathy:     Upper Body:     Right upper body: No supraclavicular or axillary  adenopathy.     Left upper body: No supraclavicular or axillary adenopathy.      Assessment:     60 year old Hispanic female returns to [facility] for her annual exam.  [employer] interpreter Matilde Sprang [phone] present during the interview and exam.  Patients daughter is also present.  Clinical breast exam unremarkable.  Taught self breast awareness.  Last pap on 08/27/18 was negative / negative.  Next pap will be due in 2025.  Patient has been screened for eligibility.  She does not have any insurance, Medicare or Medicaid.  She also meets financial eligibility.   Risk Assessment     Risk Scores       03/02/2021 08/27/2018   Last edited by: Jim Like, RN [place], Freada Bergeron, CMA   5-year risk: 0.8 % 0.8 %   Lifetime risk: 4.6 % 4.8 %               Plan:     Screening mammogram ordered.  Will follow up per [facility] protocol.

## 2021-03-02 NOTE — Progress Notes (Signed)
  Subjective:     Patient ID: Kelly Hayden, female   DOB: 20-Apr-1961, 60 y.o.   MRN: [id number]  HPI   Review of Systems     Objective:   Physical Exam     Assessment:     See notes    Plan:     See notes

## 2021-03-02 NOTE — Patient Instructions (Signed)
Gave patient hand-out, Women Staying Healthy, Active and Well from [facility], with education on breast health, pap smears, heart and colon health. 

## 2021-03-04 NOTE — Progress Notes (Unsigned)
Letter mailed from the [facility] to inform patient of her normal mammogram results.  Patient is to follow-up with annual screening in one year. 

## 2022-04-06 IMAGING — MG MM DIGITAL SCREENING BILAT W/ TOMO AND CAD
8 series · 8 of 24 positions shown · non-contrast
Comparison: Previous exam(s).

CLINICAL DATA: Screening.

EXAM:
DIGITAL SCREENING BILATERAL MAMMOGRAM WITH TOMOSYNTHESIS AND CAD
TECHNIQUE: Bilateral screening digital craniocaudal and mediolateral oblique
mammograms were obtained. Bilateral screening digital breast
tomosynthesis was performed. The images were evaluated with
computer-aided detection.

[R CC synth-2D]
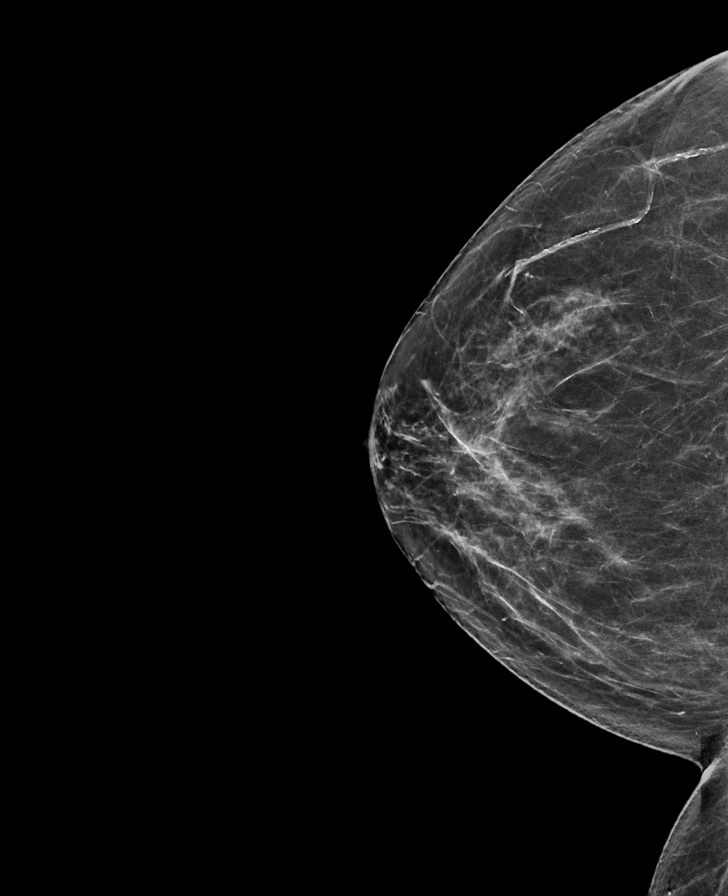

[L CC synth-2D]
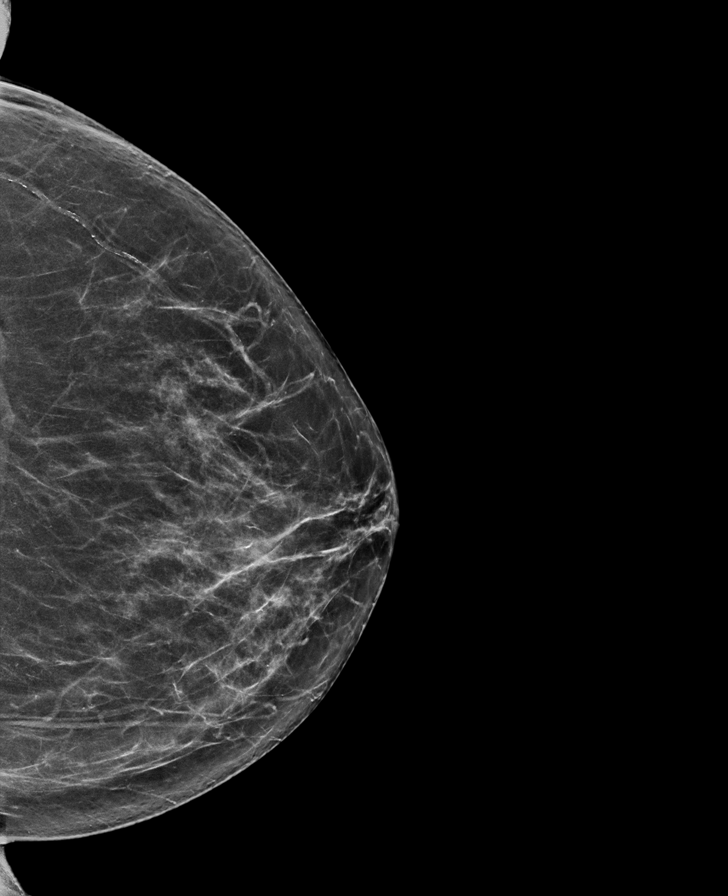

[R MLO synth-2D]
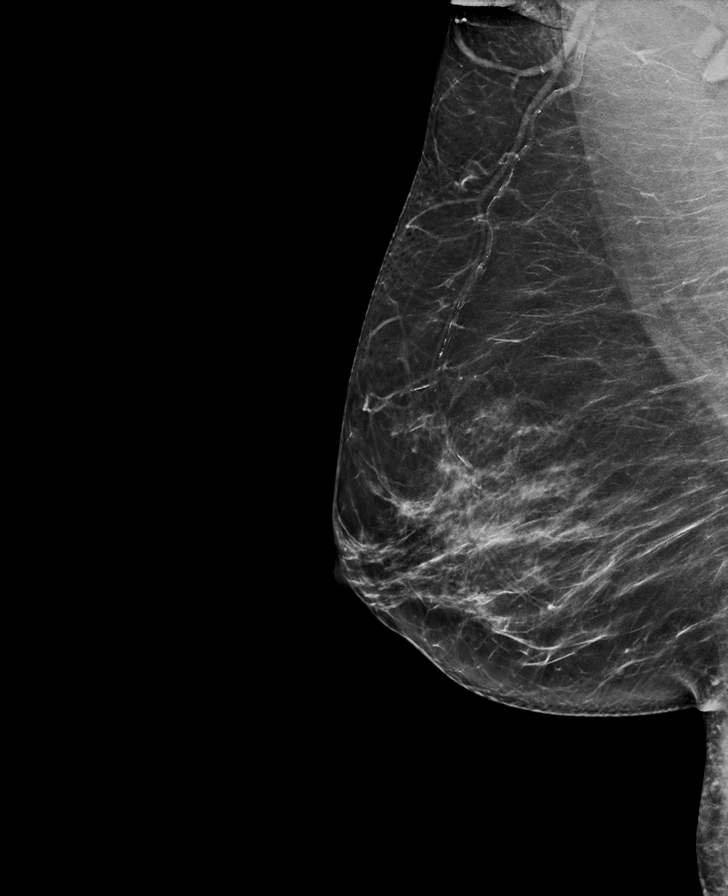

[L MLO synth-2D]
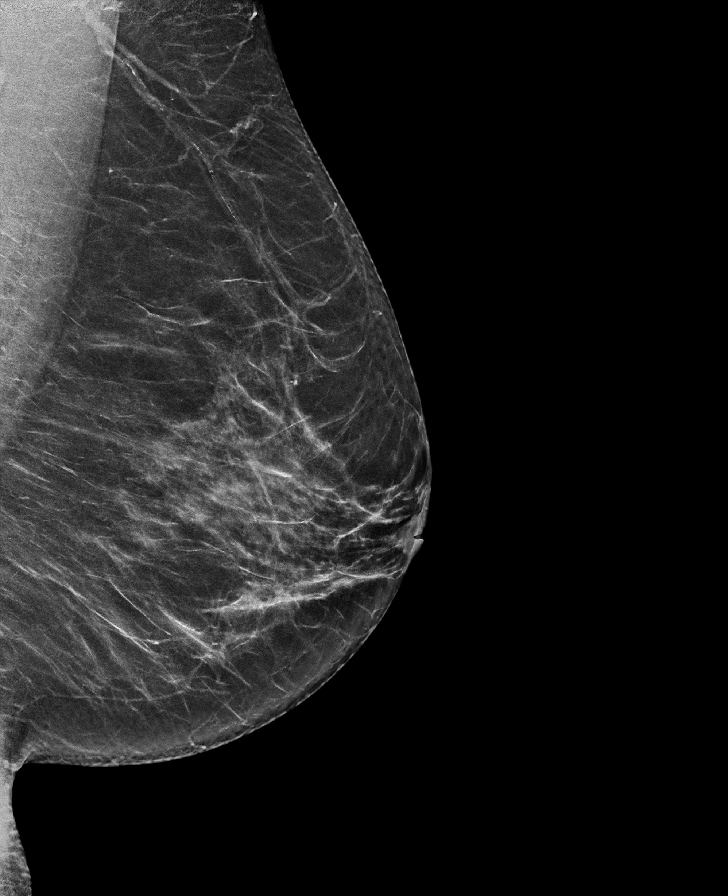

[L MLO tomo · tomo slice 35/68.0]
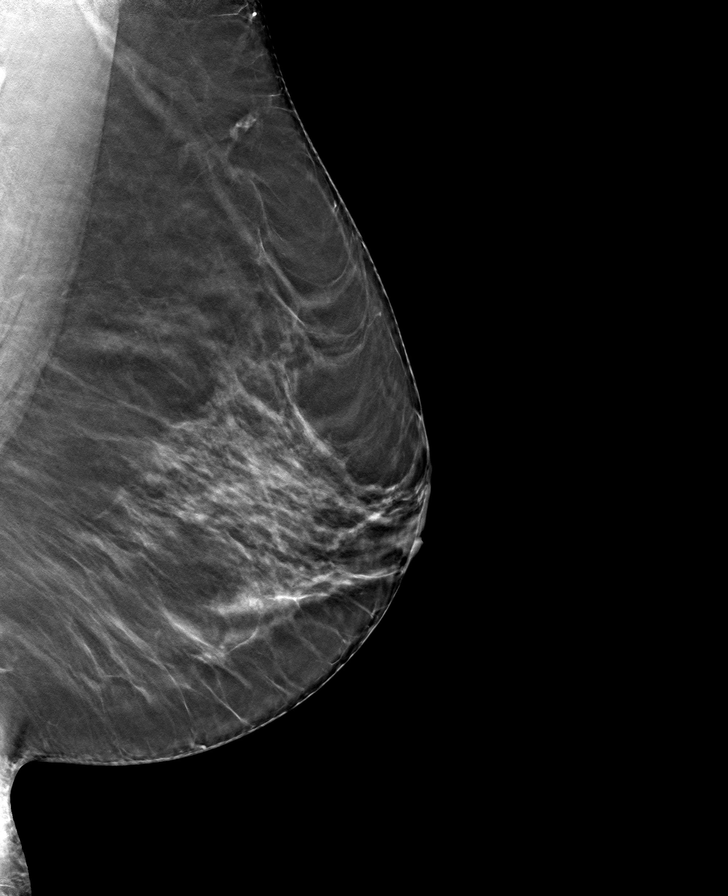

[L CC tomo · tomo slice 34/67.0]
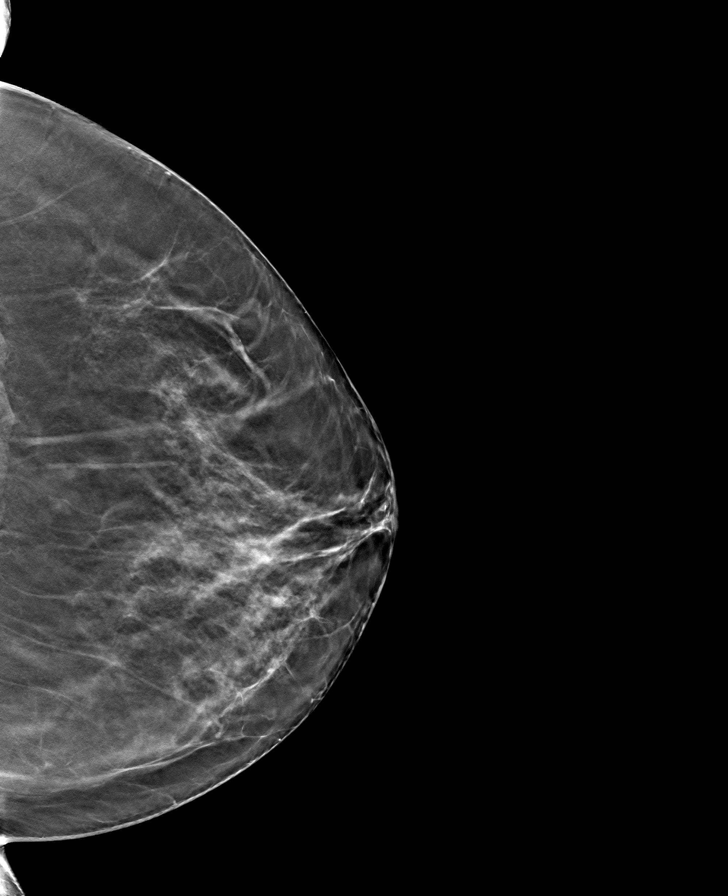

[R MLO tomo · tomo slice 35/69.0]
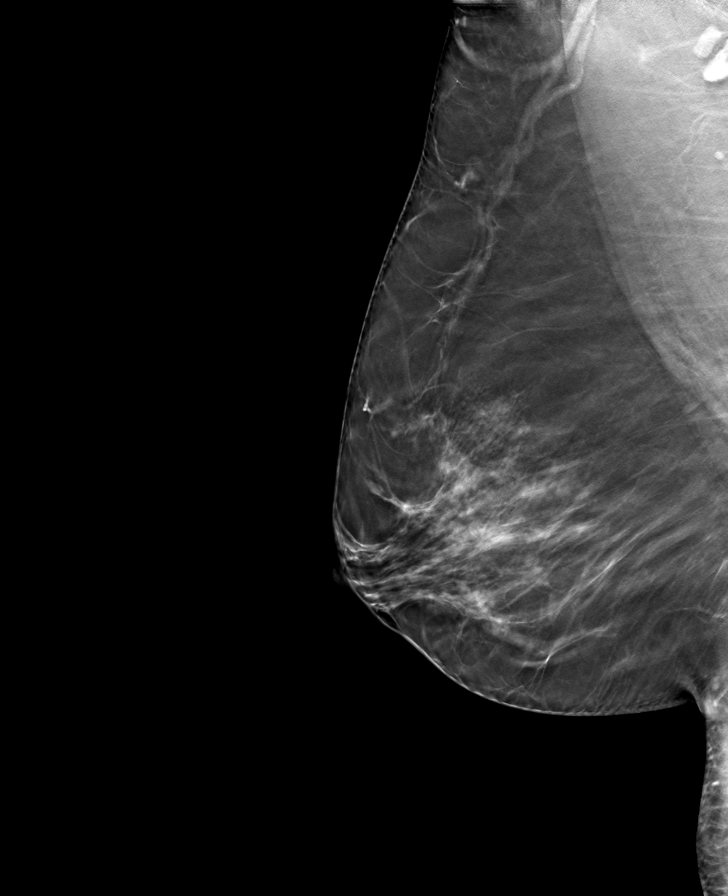

[R CC tomo · tomo slice 32/63.0]
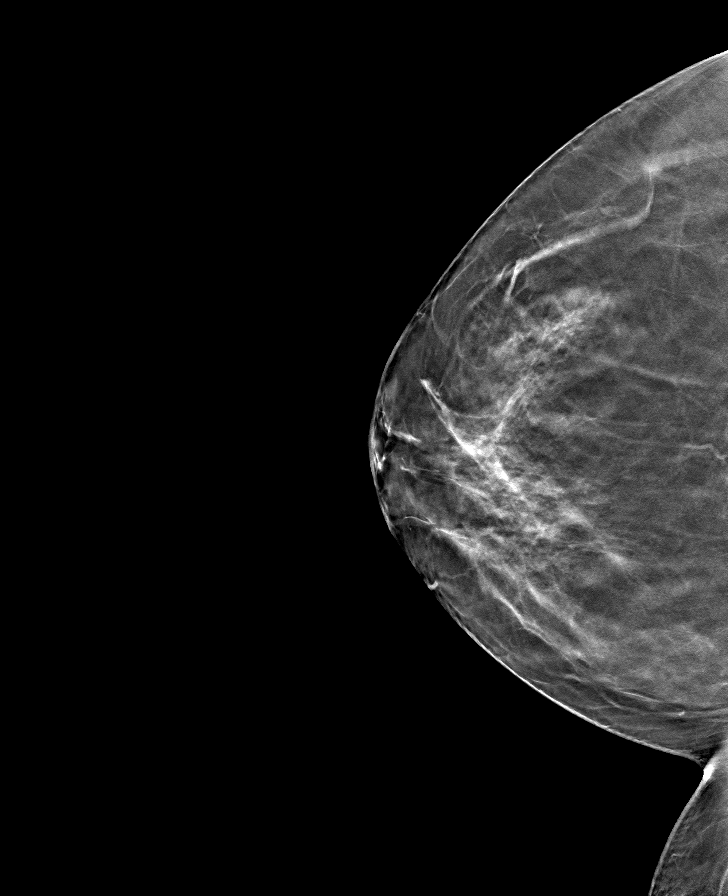

[8 of 24 positions shown; findings below may reference images not displayed]

ACR Breast Density Category b: There are scattered areas of
fibroglandular density.
FINDINGS: There are no findings suspicious for malignancy.
IMPRESSION: No mammographic evidence of malignancy. A result letter of this
screening mammogram will be mailed directly to the patient.

RECOMMENDATION:
Screening mammogram in one year. (Code:51-O-LD2)

BI-RADS CATEGORY  1: Negative.
